# Patient Record
Sex: Male | Born: 1990 | Race: Black or African American | Hispanic: No | Marital: Married | Smoking: Never smoker
Health system: Southern US, Community
[De-identification: ages and names within clinical notes are randomized; demographics above are authoritative.]

## PROBLEM LIST (undated history)

## (undated) DIAGNOSIS — S098XXA Other specified injuries of head, initial encounter: Secondary | ICD-10-CM

## (undated) DIAGNOSIS — Z13220 Encounter for screening for lipoid disorders: Secondary | ICD-10-CM

## (undated) HISTORY — DX: Other specified injuries of head, initial encounter: S09.8XXA

## (undated) HISTORY — DX: Encounter for screening for lipoid disorders: Z13.220

---
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
## 2008-12-15 ENCOUNTER — Emergency Department (HOSPITAL_COMMUNITY): Admission: EM | Admit: 2008-12-15 | Discharge: 2008-12-15 | Payer: Self-pay | Admitting: Emergency Medicine

## 2008-12-15 DIAGNOSIS — S0990XA Unspecified injury of head, initial encounter: Secondary | ICD-10-CM | POA: Insufficient documentation

## 2008-12-25 ENCOUNTER — Emergency Department (HOSPITAL_COMMUNITY): Admission: EM | Admit: 2008-12-25 | Discharge: 2008-12-25 | Payer: Self-pay | Admitting: Emergency Medicine

## 2008-12-27 ENCOUNTER — Encounter: Payer: Self-pay | Admitting: Internal Medicine

## 2008-12-27 ENCOUNTER — Ambulatory Visit: Payer: Self-pay | Admitting: Internal Medicine

## 2008-12-27 DIAGNOSIS — Z13220 Encounter for screening for lipoid disorders: Secondary | ICD-10-CM

## 2008-12-27 DIAGNOSIS — S098XXA Other specified injuries of head, initial encounter: Secondary | ICD-10-CM

## 2008-12-27 HISTORY — DX: Other specified injuries of head, initial encounter: S09.8XXA

## 2008-12-27 HISTORY — DX: Encounter for screening for lipoid disorders: Z13.220

## 2008-12-27 LAB — CONVERTED CEMR LAB
BUN: 13 mg/dL
CO2: 23 meq/L
Calcium: 10 mg/dL
Chloride: 103 meq/L
Cholesterol: 160 mg/dL
Creatinine, Ser: 1.1 mg/dL
Glucose, Bld: 96 mg/dL
HDL: 42 mg/dL
LDL Cholesterol: 102 mg/dL — ABNORMAL HIGH
Potassium: 4.6 meq/L
Sodium: 139 meq/L
Total CHOL/HDL Ratio: 3.8
Triglycerides: 82 mg/dL
VLDL: 16 mg/dL
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
## 2010-08-04 ENCOUNTER — Encounter: Payer: Self-pay | Admitting: Internal Medicine

## 2020-06-26 ENCOUNTER — Ambulatory Visit (HOSPITAL_COMMUNITY)
Admission: EM | Admit: 2020-06-26 | Discharge: 2020-06-26 | Disposition: A | Discharge status: Home / Self Care | Payer: Self-pay | Attending: Family Medicine | Admitting: Family Medicine

## 2020-06-26 ENCOUNTER — Encounter (HOSPITAL_COMMUNITY): Payer: Self-pay

## 2020-06-26 ENCOUNTER — Other Ambulatory Visit: Payer: Self-pay

## 2020-06-26 DIAGNOSIS — U071 COVID-19: Secondary | ICD-10-CM | POA: Insufficient documentation

## 2020-06-26 DIAGNOSIS — R059 Cough, unspecified: Secondary | ICD-10-CM | POA: Insufficient documentation

## 2020-06-26 DIAGNOSIS — J069 Acute upper respiratory infection, unspecified: Secondary | ICD-10-CM

## 2020-06-26 LAB — SARS CORONAVIRUS 2 (TAT 6-24 HRS): SARS Coronavirus 2: POSITIVE — AB

## 2020-06-26 MED ORDER — PROMETHAZINE-DM 6.25-15 MG/5ML PO SYRP: 5.0000 mL | ORAL_SOLUTION | Freq: Four times a day (QID) | ORAL | 0 refills | Status: DC | PRN

## 2020-06-26 NOTE — ED Triage Notes (Signed)
Pt reports headache and cough x 4 days. Denies fever, SOB.
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
## 2020-06-27 ENCOUNTER — Encounter (HOSPITAL_COMMUNITY): Payer: Self-pay

## 2020-06-27 ENCOUNTER — Telehealth (HOSPITAL_COMMUNITY): Payer: Self-pay | Admitting: Internal Medicine

## 2020-06-27 NOTE — Telephone Encounter (Signed)
Called to discuss with patient about COVID-19 symptoms and the use of one of the available treatments for those with mild to moderate Covid symptoms and at a high risk of hospitalization.  Pt appears to qualify for outpatient treatment due to co-morbid conditions and/or a member of an at-risk group in accordance with the FDA Emergency Use Authorization.    Symptom onset: unknown Vaccinated: unknown Booster? unknown Immunocompromised? no Qualifiers: BMI, SVI  Unable to reach pt - Left VM and MCM  Cyndee Brightly, NP [facility]

## 2023-08-06 ENCOUNTER — Ambulatory Visit (HOSPITAL_COMMUNITY)
Admission: EM | Admit: 2023-08-06 | Discharge: 2023-08-06 | Disposition: A | Discharge status: Home / Self Care | Payer: Self-pay | Attending: Family Medicine | Admitting: Family Medicine

## 2023-08-06 ENCOUNTER — Encounter (HOSPITAL_COMMUNITY): Payer: Self-pay | Admitting: *Deleted

## 2023-08-06 ENCOUNTER — Ambulatory Visit (INDEPENDENT_AMBULATORY_CARE_PROVIDER_SITE_OTHER): Payer: Self-pay

## 2023-08-06 ENCOUNTER — Other Ambulatory Visit: Payer: Self-pay

## 2023-08-06 DIAGNOSIS — M79671 Pain in right foot: Secondary | ICD-10-CM

## 2023-08-06 DIAGNOSIS — L03115 Cellulitis of right lower limb: Secondary | ICD-10-CM

## 2023-08-06 MED ORDER — CEFTRIAXONE SODIUM 1 G IJ SOLR
INTRAMUSCULAR | Status: AC
  Filled 2023-08-06: qty 10

## 2023-08-06 MED ORDER — DOXYCYCLINE HYCLATE 100 MG PO CAPS: 100.0000 mg | ORAL_CAPSULE | Freq: Two times a day (BID) | ORAL | 0 refills | Status: DC

## 2023-08-06 MED ORDER — STERILE WATER FOR INJECTION IJ SOLN
INTRAMUSCULAR | Status: AC
  Filled 2023-08-06: qty 10

## 2023-08-06 MED ORDER — CEFTRIAXONE SODIUM 1 G IJ SOLR
1.0000 g | Freq: Once | INTRAMUSCULAR | Status: AC
  Administered 2023-08-06: 1 g via INTRAMUSCULAR

## 2023-08-06 NOTE — ED Triage Notes (Signed)
 Pt reports hitting his rt foot on wall Tuesday night. Pt presents today with a wrap to RT ankleand foot. Rt foot is swollen and skin is red in color.

## 2023-08-06 NOTE — Discharge Instructions (Addendum)
 You were seen today for foot pain and swelling.  Your xray appears normal.  If radiology reads this differently we will call to notify you.  I am more concerned about possible infection in the foot.  I have given you a shot of an antibiotic while here, and sent out oral antibiotics to start tomorrow.  You should elevate the foot, use ice, and motrin for pain.  If you have worsening symptoms despite treatment, please go to the ER for evaluation.
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
## 2024-01-04 DIAGNOSIS — Z419 Encounter for procedure for purposes other than remedying health state, unspecified: Secondary | ICD-10-CM | POA: Diagnosis not present

## 2024-02-04 DIAGNOSIS — Z419 Encounter for procedure for purposes other than remedying health state, unspecified: Secondary | ICD-10-CM | POA: Diagnosis not present

## 2024-02-07 DIAGNOSIS — Z7689 Persons encountering health services in other specified circumstances: Secondary | ICD-10-CM | POA: Diagnosis not present

## 2024-02-23 ENCOUNTER — Telehealth (INDEPENDENT_AMBULATORY_CARE_PROVIDER_SITE_OTHER): Payer: Self-pay | Admitting: Primary Care

## 2024-02-23 NOTE — Telephone Encounter (Signed)
 Called pt to reschedule appt. Provider will not be in office the day of appt. Pt will be canceled and if call back please reschedule.

## 2024-03-05 DIAGNOSIS — Z419 Encounter for procedure for purposes other than remedying health state, unspecified: Secondary | ICD-10-CM | POA: Diagnosis not present

## 2024-03-06 ENCOUNTER — Ambulatory Visit (INDEPENDENT_AMBULATORY_CARE_PROVIDER_SITE_OTHER): Admitting: Primary Care

## 2024-05-01 ENCOUNTER — Encounter (INDEPENDENT_AMBULATORY_CARE_PROVIDER_SITE_OTHER): Payer: Self-pay | Admitting: Primary Care

## 2024-05-01 ENCOUNTER — Ambulatory Visit (INDEPENDENT_AMBULATORY_CARE_PROVIDER_SITE_OTHER): Admitting: Primary Care

## 2024-05-01 VITALS — BP 132/82 | HR 99 | Resp 16 | Ht 74.0 in | Wt 230.0 lb

## 2024-05-01 DIAGNOSIS — Z6829 Body mass index (BMI) 29.0-29.9, adult: Secondary | ICD-10-CM

## 2024-05-01 DIAGNOSIS — F1010 Alcohol abuse, uncomplicated: Secondary | ICD-10-CM

## 2024-05-01 DIAGNOSIS — I83892 Varicose veins of left lower extremities with other complications: Secondary | ICD-10-CM | POA: Diagnosis not present

## 2024-05-01 DIAGNOSIS — Z7689 Persons encountering health services in other specified circumstances: Secondary | ICD-10-CM

## 2024-05-01 DIAGNOSIS — R030 Elevated blood-pressure reading, without diagnosis of hypertension: Secondary | ICD-10-CM | POA: Diagnosis not present

## 2024-05-01 DIAGNOSIS — E663 Overweight: Secondary | ICD-10-CM

## 2024-05-01 DIAGNOSIS — Z716 Tobacco abuse counseling: Secondary | ICD-10-CM

## 2024-05-01 DIAGNOSIS — Z0184 Encounter for antibody response examination: Secondary | ICD-10-CM

## 2024-05-01 DIAGNOSIS — Z1159 Encounter for screening for other viral diseases: Secondary | ICD-10-CM
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
## 2024-05-01 NOTE — Patient Instructions (Signed)

## 2024-05-31 ENCOUNTER — Telehealth (INDEPENDENT_AMBULATORY_CARE_PROVIDER_SITE_OTHER): Payer: Self-pay | Admitting: Primary Care

## 2024-05-31 NOTE — Telephone Encounter (Signed)
 Called pt to confirm appt. Pt did not answer and LVM

## 2024-06-01 ENCOUNTER — Ambulatory Visit (INDEPENDENT_AMBULATORY_CARE_PROVIDER_SITE_OTHER): Admitting: Primary Care

## 2024-06-01 ENCOUNTER — Encounter (INDEPENDENT_AMBULATORY_CARE_PROVIDER_SITE_OTHER): Payer: Self-pay | Admitting: Primary Care

## 2024-06-01 VITALS — BP 144/95 | HR 87 | Resp 16 | Ht 73.0 in | Wt 224.4 lb

## 2024-06-01 DIAGNOSIS — Z1159 Encounter for screening for other viral diseases: Secondary | ICD-10-CM | POA: Diagnosis not present

## 2024-06-01 DIAGNOSIS — Z7689 Persons encountering health services in other specified circumstances: Secondary | ICD-10-CM

## 2024-06-01 DIAGNOSIS — Z0184 Encounter for antibody response examination: Secondary | ICD-10-CM

## 2024-06-01 DIAGNOSIS — I10 Essential (primary) hypertension: Secondary | ICD-10-CM | POA: Diagnosis not present

## 2024-06-01 DIAGNOSIS — F1010 Alcohol abuse, uncomplicated: Secondary | ICD-10-CM

## 2024-06-01 DIAGNOSIS — I83892 Varicose veins of left lower extremities with other complications: Secondary | ICD-10-CM | POA: Diagnosis not present

## 2024-06-01 DIAGNOSIS — E663 Overweight: Secondary | ICD-10-CM

## 2024-06-01 NOTE — Progress Notes (Signed)
 " [facility]   Grant Morales is a 34 y.o. male presents for hypertension evaluation, Denies shortness of breath, headaches, chest pain or lower extremity edema, sudden onset, vision changes, unilateral weakness, dizziness, paresthesias   Patient reports adherence with medications.90 %  Past Medical History:  Diagnosis Date   Blunt head trauma 12/27/2008   patient had 6 staples removed from his head after blunt trauma on July 24, normocephalic with a small scar  x 4cm on the left side of his head, site healing well, no sign of hematoma or infection. Pt is feeling well, denies any LOC, fever,  nausea, and vomiting   Need for lipid screening 12/27/2008   patient requested lipid profile to be done due to family history of hypercholesterolemia   Past Surgical History:  Procedure Laterality Date   ORTHOPEDIC SURGERY  1998   on forearm   Allergies[1] Medications Ordered Prior to Encounter[2] Social History   Socioeconomic History   Marital status: Married    Spouse name: Not on file   Number of children: Not on file   Years of education: Not on file   Highest education level: Bachelor's degree (e.g., BA, AB, BS)  Occupational History   Not on file  Tobacco Use   Smoking status: Never   Smokeless tobacco: Never  Substance and Sexual Activity   Alcohol use: No   Drug use: No   Sexual activity: Not on file  Other Topics Concern   Not on file  Social History Narrative   Not on file   Social Drivers of Health   Tobacco Use: Low Risk (06/01/2024)   Patient History    Smoking Tobacco Use: Never    Smokeless Tobacco Use: Never    Passive Exposure: Not on file  Financial Resource Strain: Low Risk (04/30/2024)   Overall Financial Resource Strain (CARDIA)    Difficulty of Paying Living Expenses: Not hard at all  Food Insecurity: No Food Insecurity (04/30/2024)   Epic    Worried About Radiation Protection Practitioner of Food in the Last Year: Never true    Ran Out of Food in the Last  Year: Never true  Transportation Needs: No Transportation Needs (04/30/2024)   Epic    Lack of Transportation (Medical): No    Lack of Transportation (Non-Medical): No  Physical Activity: Inactive (04/30/2024)   Exercise Vital Sign    Days of Exercise per Week: 0 days    Minutes of Exercise per Session: Not on file  Stress: No Stress Concern Present (04/30/2024)   [employer] of Occupational Health - Occupational Stress Questionnaire    Feeling of Stress: Not at all  Social Connections: Moderately Integrated (04/30/2024)   Social Connection and Isolation Panel    Frequency of Communication with Friends and Family: More than three times a week    Frequency of Social Gatherings with Friends and Family: Once a week    Attends Religious Services: More than 4 times per year    Active Member of [employer] or Organizations: No    Attends Banker Meetings: Not on file    Marital Status: Living with partner  Intimate Partner Violence: Not on file  Depression (PHQ2-9): Low Risk (06/01/2024)   Depression (PHQ2-9)    PHQ-2 Score: 0  Alcohol Screen: High Risk (04/30/2024)   Alcohol Screen    Last Alcohol Screening Score (AUDIT): 17  Housing: Low Risk (04/30/2024)   Epic    Unable to Pay for Housing in the Last Year:  No    Number of Times Moved in the Last Year: 0    Homeless in the Last Year: No  Utilities: Not At Risk (05/01/2024)   Epic    Threatened with loss of utilities: No  Health Literacy: Not on file   Family History  Problem Relation Age of Onset   Hyperlipidemia Other    Health Maintenance  Topic Date Due   HIV Screening  Never done   Hepatitis C Screening  Never done   DTaP/Tdap/Td (1 - Tdap) Never done   Hepatitis B Vaccines 19-59 Average Risk (1 of 3 - 19+ 3-dose series) Never done   HPV VACCINES (1 - 3-dose SCDM series) Never done   COVID-19 Vaccine (1 - 2025-26 season) Never done   Influenza Vaccine  08/22/2024 (Originally 12/24/2023)   Pneumococcal Vaccine   Aged Out   Meningococcal B Vaccine  Aged Out     OBJECTIVE:  Vitals:   06/01/24 1456  BP: (!) 144/95  Pulse: 87  Resp: 16  SpO2: 98%  Weight: 224 lb 6.4 oz (101.8 kg)  Height: 6' 1 (1.854 m)    Physical Exam Vitals reviewed.  Constitutional:      Appearance: Normal appearance.  HENT:     Head: Normocephalic.     Right Ear: Tympanic membrane, ear canal and external ear normal.     Left Ear: Tympanic membrane, ear canal and external ear normal.     Nose: Nose normal.  Eyes:     Extraocular Movements: Extraocular movements intact.     Pupils: Pupils are equal, round, and reactive to light.  Cardiovascular:     Rate and Rhythm: Normal rate and regular rhythm.  Pulmonary:     Effort: Pulmonary effort is normal.     Breath sounds: Normal breath sounds.  Abdominal:     General: Bowel sounds are normal. There is distension.     Palpations: Abdomen is soft.  Musculoskeletal:        General: Normal range of motion.     Cervical back: Normal range of motion and neck supple.  Skin:    General: Skin is warm and dry.  Neurological:     Mental Status: He is alert and oriented to person, place, and time.  Psychiatric:        Mood and Affect: Mood normal.        Behavior: Behavior normal.        Thought Content: Thought content normal.        Judgment: Judgment normal.      ROS  Last 3 Office BP readings: BP Readings from Last 3 Encounters:  06/01/24 (!) 144/95  05/01/24 132/82  08/06/23 (!) 143/97    BMET    Component Value Date/Time   NA 139 12/27/2008 2014   K 4.6 12/27/2008 2014   CL 103 12/27/2008 2014   CO2 23 12/27/2008 2014   GLUCOSE 96 12/27/2008 2014   BUN 13 12/27/2008 2014   CREATININE 1.10 12/27/2008 2014   CALCIUM 10.0 12/27/2008 2014    Renal function: CrCl cannot be calculated (Patient's most recent lab result is older than the maximum 21 days allowed.).  Clinical ASCVD: No  The ASCVD Risk score (Arnett DK, et al., 2019) failed to  calculate for the following reasons:   The 2019 ASCVD risk score is only valid for ages 58 to 25   * - Cholesterol units were assumed  ASCVD risk factors include- CHAD   ASSESSMENT & PLAN:  1. Immunity status testing   2. Screening for viral disease   3. Over weight   4. Encounter to establish care   5. Alcohol abuse   6. Varicose veins of left leg with edema     No orders of the defined types were placed in this encounter. Swayze was seen today for blood pressure check.  Diagnoses and all orders for this visit:  Immunity status testing -     Hepatitis B surface antibody,qualitative  Screening for viral disease -     HIV Antibody (routine testing w rflx) -     HCV Ab w Reflex to Quant PCR  Over weight Discussed diet and exercise for person with BMI >25. Instructed: You must burn more calories than you eat. Losing 5 percent of your body weight should be considered a success. In the longer term, losing more than 15 percent of your body weight and staying at this weight is an extremely good result. However, keep in mind that even losing 5 percent of your body weight leads to important health benefits, so try not to get discouraged if you're not able to lose more than this. Will recheck weight in 3-6 months.  -     Lipid panel  Alcohol abuse -     CMP14+EGFR  Varicose veins of left leg with edema -     CBC with Differential/Platele Ambulatory referral to Vascular Surgery     Essential hypertension  -Counseled on lifestyle modifications for blood pressure control including reduced dietary sodium, increased exercise, weight reduction and adequate sleep. Also, educated patient about the risk for cardiovascular events, stroke and heart attack. Also counseled patient about the importance of medication adherence. If you participate in smoking, it is important to stop using tobacco as this will increase the risks associated with uncontrolled blood pressure.  Refuses medications   Minimize salt intake. Minimize alcohol intake    This note has been created with Education officer, environmental. Any transcriptional errors are unintentional.   Rosaline SHAUNNA Bohr, NP 06/01/2024, 3:08 PM       [1] No Known Allergies [2]  No current outpatient medications on file prior to visit.   No current facility-administered medications on file prior to visit.   "

## 2024-06-02 LAB — CBC WITH DIFFERENTIAL/PLATELET
Basophils Absolute: 0 x10E3/uL (ref 0.0–0.2)
Basos: 1 %
EOS (ABSOLUTE): 0 x10E3/uL (ref 0.0–0.4)
Eos: 0 %
Hematocrit: 46.4 % (ref 37.5–51.0)
Hemoglobin: 15.2 g/dL (ref 13.0–17.7)
Immature Grans (Abs): 0 x10E3/uL (ref 0.0–0.1)
Immature Granulocytes: 0 %
Lymphocytes Absolute: 1.5 x10E3/uL (ref 0.7–3.1)
Lymphs: 54 %
MCH: 31 pg (ref 26.6–33.0)
MCHC: 32.8 g/dL (ref 31.5–35.7)
MCV: 95 fL (ref 79–97)
Monocytes Absolute: 0.4 x10E3/uL (ref 0.1–0.9)
Monocytes: 15 %
Neutrophils Absolute: 0.8 x10E3/uL — ABNORMAL LOW (ref 1.4–7.0)
Neutrophils: 30 %
Platelets: 263 x10E3/uL (ref 150–450)
RBC: 4.91 x10E6/uL (ref 4.14–5.80)
RDW: 12.8 % (ref 11.6–15.4)
WBC: 2.7 x10E3/uL — ABNORMAL LOW (ref 3.4–10.8)

## 2024-06-02 LAB — HCV AB W REFLEX TO QUANT PCR: HCV Ab: NONREACTIVE

## 2024-06-02 LAB — CMP14+EGFR
ALT: 52 IU/L — ABNORMAL HIGH (ref 0–44)
AST: 67 IU/L — ABNORMAL HIGH (ref 0–40)
Albumin: 4.6 g/dL (ref 4.1–5.1)
Alkaline Phosphatase: 96 IU/L (ref 47–123)
BUN/Creatinine Ratio: 5 — ABNORMAL LOW (ref 9–20)
BUN: 4 mg/dL — ABNORMAL LOW (ref 6–20)
Bilirubin Total: 0.8 mg/dL (ref 0.0–1.2)
CO2: 24 mmol/L (ref 20–29)
Calcium: 9.3 mg/dL (ref 8.7–10.2)
Chloride: 100 mmol/L (ref 96–106)
Creatinine, Ser: 0.86 mg/dL (ref 0.76–1.27)
Globulin, Total: 3.3 g/dL (ref 1.5–4.5)
Glucose: 94 mg/dL (ref 70–99)
Potassium: 4.2 mmol/L (ref 3.5–5.2)
Sodium: 142 mmol/L (ref 134–144)
Total Protein: 7.9 g/dL (ref 6.0–8.5)
eGFR: 116 mL/min/1.73

## 2024-06-02 LAB — LIPID PANEL
Chol/HDL Ratio: 1.7 ratio (ref 0.0–5.0)
Cholesterol, Total: 179 mg/dL (ref 100–199)
HDL: 107 mg/dL
LDL Chol Calc (NIH): 63 mg/dL (ref 0–99)
Triglycerides: 40 mg/dL (ref 0–149)
VLDL Cholesterol Cal: 9 mg/dL (ref 5–40)

## 2024-06-02 LAB — HEPATITIS B SURFACE ANTIBODY,QUALITATIVE: Hep B Surface Ab, Qual: NONREACTIVE

## 2024-06-02 LAB — HIV ANTIBODY (ROUTINE TESTING W REFLEX): HIV Screen 4th Generation wRfx: NONREACTIVE

## 2024-06-02 LAB — HCV INTERPRETATION

## 2024-06-18 ENCOUNTER — Ambulatory Visit (INDEPENDENT_AMBULATORY_CARE_PROVIDER_SITE_OTHER): Payer: Self-pay | Admitting: Primary Care

## 2024-06-18 DIAGNOSIS — D709 Neutropenia, unspecified: Secondary | ICD-10-CM

## 2024-07-13 ENCOUNTER — Inpatient Hospital Stay: Admitting: Physician Assistant

## 2024-07-13 ENCOUNTER — Inpatient Hospital Stay

## 2024-10-17 ENCOUNTER — Ambulatory Visit (HOSPITAL_COMMUNITY)

## 2024-10-17 ENCOUNTER — Encounter: Admitting: Vascular Surgery
